# Patient Record
Sex: Female | Born: 1990 | Race: White | Hispanic: No | Marital: Single | Smoking: Never smoker
Health system: Southern US, Community
[De-identification: ages and names within clinical notes are randomized; demographics above are authoritative.]

---

## 2013-10-10 ENCOUNTER — Encounter (HOSPITAL_COMMUNITY): Payer: Self-pay | Admitting: Emergency Medicine

## 2013-10-10 ENCOUNTER — Emergency Department (HOSPITAL_COMMUNITY): Payer: BC Managed Care – PPO

## 2013-10-10 ENCOUNTER — Emergency Department (HOSPITAL_COMMUNITY)
Admission: EM | Admit: 2013-10-10 | Discharge: 2013-10-10 | Disposition: A | Discharge status: Home / Self Care | Payer: BC Managed Care – PPO | Attending: Emergency Medicine | Admitting: Emergency Medicine

## 2013-10-10 DIAGNOSIS — Z880 Allergy status to penicillin: Secondary | ICD-10-CM | POA: Insufficient documentation

## 2013-10-10 DIAGNOSIS — R0781 Pleurodynia: Secondary | ICD-10-CM

## 2013-10-10 DIAGNOSIS — R079 Chest pain, unspecified: Secondary | ICD-10-CM | POA: Insufficient documentation

## 2013-10-10 DIAGNOSIS — Z79899 Other long term (current) drug therapy: Secondary | ICD-10-CM | POA: Insufficient documentation

## 2013-10-10 MED ORDER — IBUPROFEN 400 MG PO TABS
800.0000 mg | ORAL_TABLET | Freq: Once | ORAL | Status: AC
  Administered 2013-10-10: 800 mg via ORAL
  Filled 2013-10-10: qty 2

## 2013-10-10 MED ORDER — TRAMADOL HCL 50 MG PO TABS: 50.0000 mg | ORAL_TABLET | Freq: Four times a day (QID) | ORAL | Status: AC | PRN

## 2013-10-10 NOTE — ED Notes (Signed)
PT ambulated with baseline gait; VSS; A&Ox3; no signs of distress; respirations even and unlabored; skin warm and dry; no questions upon discharge.  

## 2013-10-10 NOTE — ED Notes (Signed)
No obvious deformity noted to rib area.

## 2013-10-10 NOTE — Discharge Instructions (Signed)

## 2013-10-10 NOTE — ED Notes (Addendum)
Pt reports that she was brushing her teeth and gagged this morning. States she heard a pop in her ribs on the left side. States she went to [facility] and told she could have a hairline fx but they were unsure. Reports she had an x-ray done last Sunday

## 2013-10-10 NOTE — ED Notes (Signed)
Pt is third in line for xray.

## 2013-10-10 NOTE — ED Notes (Signed)
Pt transported to xray 

## 2013-10-10 NOTE — ED Provider Notes (Signed)
CSN: [id number]     Arrival date & time 10/10/13  1720 History   This chart was scribed for non-physician practitioner Junius FinnerErin O'Malley, PA-C working with Laray AngerKathleen M McManus, DO by Donne Anonayla Curran, ED Scribe. This patient was seen in room TR07C/TR07C and the patient's care was started at 1739.   First MD Initiated Contact with Patient 10/10/13 1739     Chief Complaint  Patient presents with  . rib pain     The history is provided by the patient. No language interpreter was used.   HPI Comments: Gail Smith is a 23 y.o. female who presents to the Emergency Department complaining of 1 week of gradual onset, gradually worsening left rib pain that began when she had a severe cough, which has since resolved. She states the rib pain acutely worsened this morning while she was brushing her teeth and heard a "crack." She has tried 800 mg ibuprofen with mild relief. She states she went to her campus health clinic 1 week ago and was told there was possibly a hairline fracture present. She denies recent trauma or falls. She denies bruising to the area, redness to the area or any other symptoms.   History reviewed. No pertinent past medical history. History reviewed. No pertinent past surgical history. History reviewed. No pertinent family history. History  Substance Use Topics  . Smoking status: Never Smoker   . Smokeless tobacco: Not on file  . Alcohol Use: No   OB History   Grav Para Term Preterm Abortions TAB SAB Ect Mult Living                 Review of Systems  Musculoskeletal: Positive for arthralgias.    Allergies  Penicillins  Home Medications   Current Outpatient Rx  Name  Route  Sig  Dispense  Refill  . albuterol (PROVENTIL HFA;VENTOLIN HFA) 108 (90 BASE) MCG/ACT inhaler   Inhalation   Inhale 1 puff into the lungs every 6 (six) hours as needed for wheezing or shortness of breath.         . desloratadine-pseudoephedrine (CLARINEX-D 12-HOUR) 2.5-120 MG per tablet    Oral   Take 1 tablet by mouth 2 (two) times daily.         Marland Kitchen. FLUoxetine (PROZAC) 20 MG tablet   Oral   Take 20 mg by mouth daily.         Marland Kitchen. guaiFENesin-codeine 100-10 MG/5ML syrup   Oral   Take 5 mLs by mouth 3 (three) times daily as needed for cough.         Marland Kitchen. ibuprofen (ADVIL,MOTRIN) 800 MG tablet   Oral   Take 800 mg by mouth every 8 (eight) hours as needed.         Marland Kitchen. lisdexamfetamine (VYVANSE) 40 MG capsule   Oral   Take 40 mg by mouth every morning.         . sulfamethoxazole-trimethoprim (BACTRIM DS) 800-160 MG per tablet   Oral   Take 1 tablet by mouth 2 (two) times daily.         . traMADol (ULTRAM) 50 MG tablet   Oral   Take 1 tablet (50 mg total) by mouth every 6 (six) hours as needed.   15 tablet   0    BP 100/65  Pulse 85  Temp(Src) 97 F (36.1 C) (Oral)  Resp 18  Ht 5\' 2"  (1.575 m)  Wt 110 lb (49.896 kg)  BMI 20.11 kg/m2  SpO2 100%  Physical  Exam  Nursing note and vitals reviewed. Constitutional: She appears well-developed and well-nourished. No distress.  HENT:  Head: Normocephalic and atraumatic.  Eyes: Conjunctivae are normal. No scleral icterus.  Neck: Normal range of motion.  Cardiovascular: Normal rate, regular rhythm and normal heart sounds.   Pulmonary/Chest: Effort normal and breath sounds normal. No respiratory distress. She has no wheezes. She has no rales. She exhibits no tenderness.  Tenderness between rib 5 and 6 left anterior mid axillary. No crepitance, deformity, erythema or ecchymosis.  Abdominal: Soft. Bowel sounds are normal. She exhibits no distension and no mass. There is no tenderness. There is no rebound and no guarding.  Musculoskeletal: Normal range of motion.  Neurological: She is alert.  Skin: Skin is warm and dry. She is not diaphoretic.    ED Course  Procedures (including critical care time) DIAGNOSTIC STUDIES: Oxygen Saturation is 99% on RA, normal by my interpretation.    COORDINATION OF CARE: 6:43  PM Discussed treatment plan which includes CXR with pt at bedside and pt agreed to plan. Advised pt to ice and rest the area.   Labs Review Labs Reviewed - No data to display Imaging Review Dg Ribs Unilateral W/chest Left  10/10/2013   CLINICAL DATA:  left pain, "pop"  .  Coughing with chest pain  EXAM: LEFT RIBS AND CHEST - 3+ VIEW  COMPARISON:  None.  FINDINGS: Normal cardiac silhouette. No pleural fluid, pulmonary edema, or pneumothorax. No evidence of infiltrate.  Dedicated views of the left ribs demonstrate no fracture. Skin marker marks the area of pain.  IMPRESSION: 1. No evidence of thoracic trauma. 2. No evidence of rib fracture or pneumothorax. 3. No evidence infiltrate.   Electronically Signed   By: Genevive Bi M.D.   On: 10/10/2013 19:17    EKG Interpretation   None       MDM   1. Rib pain on left side    CXR: unremarkable. Tx: pain control, reassurance, f/u with PCP. Return precautions provided. Pt verbalized understanding and agreement with tx plan.   I personally performed the services described in this documentation, which was scribed in my presence. The recorded information has been reviewed and is accurate.    Junius Finner, PA-C 10/10/13 2305

## 2013-10-11 NOTE — ED Provider Notes (Signed)
Medical screening examination/treatment/procedure(s) were performed by non-physician practitioner and as supervising physician I was immediately available for consultation/collaboration.  EKG Interpretation   None         Laray AngerKathleen M Baylee Mccorkel, DO 10/11/13 1145

## 2015-11-27 ENCOUNTER — Other Ambulatory Visit: Payer: Self-pay | Admitting: Orthopedic Surgery

## 2015-11-27 DIAGNOSIS — S2232XA Fracture of one rib, left side, initial encounter for closed fracture: Secondary | ICD-10-CM

## 2015-11-29 ENCOUNTER — Ambulatory Visit
Admission: RE | Admit: 2015-11-29 | Discharge: 2015-11-29 | Disposition: A | Discharge status: Home / Self Care | Payer: BLUE CROSS/BLUE SHIELD | Source: Ambulatory Visit | Attending: Orthopedic Surgery | Admitting: Orthopedic Surgery

## 2015-11-29 DIAGNOSIS — S2232XA Fracture of one rib, left side, initial encounter for closed fracture: Secondary | ICD-10-CM

## 2015-11-29 MED ORDER — IOPAMIDOL (ISOVUE-300) INJECTION 61%
75.0000 mL | Freq: Once | INTRAVENOUS | Status: AC | PRN
  Administered 2015-11-29: 75 mL via INTRAVENOUS

## 2015-12-20 ENCOUNTER — Institutional Professional Consult (permissible substitution) (INDEPENDENT_AMBULATORY_CARE_PROVIDER_SITE_OTHER): Payer: BLUE CROSS/BLUE SHIELD | Admitting: Cardiothoracic Surgery

## 2015-12-20 ENCOUNTER — Encounter: Payer: Self-pay | Admitting: Cardiothoracic Surgery

## 2015-12-20 VITALS — BP 129/87 | HR 102 | Resp 16 | Ht 62.0 in | Wt 130.0 lb

## 2015-12-20 DIAGNOSIS — R0789 Other chest pain: Secondary | ICD-10-CM | POA: Diagnosis not present

## 2015-12-20 DIAGNOSIS — G8929 Other chronic pain: Secondary | ICD-10-CM | POA: Diagnosis not present

## 2015-12-20 DIAGNOSIS — S2232XA Fracture of one rib, left side, initial encounter for closed fracture: Secondary | ICD-10-CM | POA: Diagnosis not present
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
## 2016-02-27 ENCOUNTER — Ambulatory Visit: Payer: BLUE CROSS/BLUE SHIELD | Admitting: Cardiothoracic Surgery

## 2016-02-29 ENCOUNTER — Ambulatory Visit: Payer: BLUE CROSS/BLUE SHIELD | Admitting: Cardiothoracic Surgery

## 2016-10-16 IMAGING — CT CT CHEST W/ CM
2 of 4 series · 15 of 36 positions shown, 18 images · IV contrast (APPLIED)
Comparison: No priors.

CLINICAL DATA: 24-year-old female with history of right-sided rib
fracture complaining of 3 months of chest pain.

EXAM:
CT CHEST WITH CONTRAST
TECHNIQUE: Multidetector CT imaging of the chest was performed during
intravenous contrast administration.
CONTRAST:  75mL 4T6GQ3-8LL IOPAMIDOL (4T6GQ3-8LL) INJECTION 61%

[Series 2: chest w/cm · axial · 0.57mm/px · z∈[-41,+214]mm · 12 of 57 slices shown, 15 images]
[im 3/57  mediastinal]
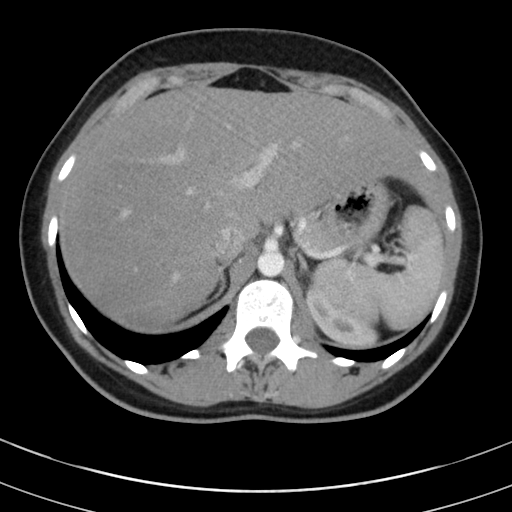
[im 3/57  lung]
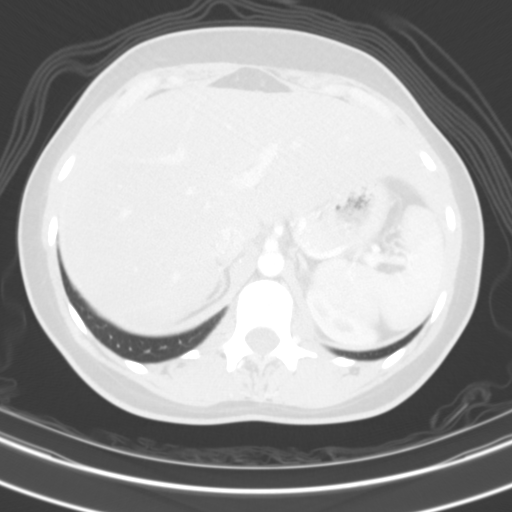
[im 8/57  lung]
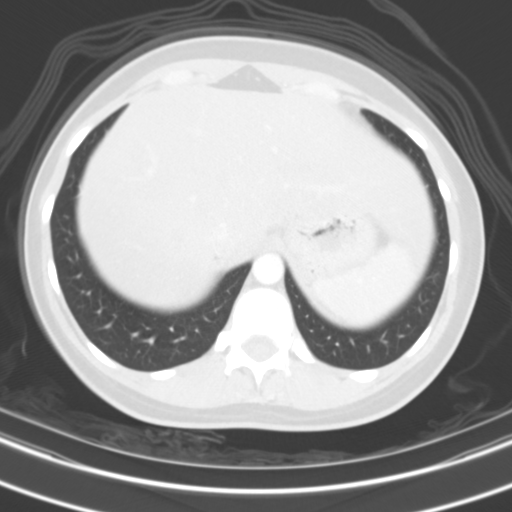
[im 12/57  lung]
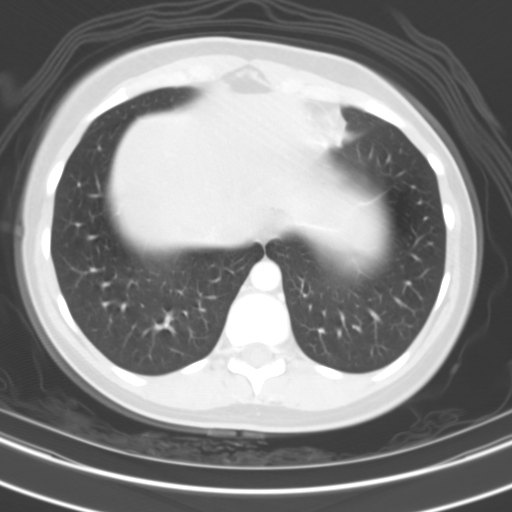
[im 17/57  lung]
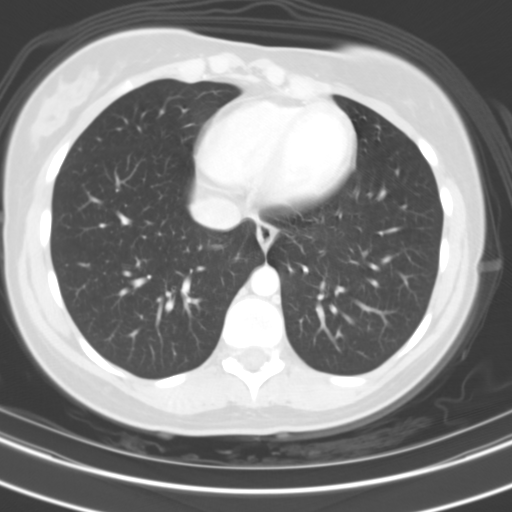
[im 22/57  mediastinal]
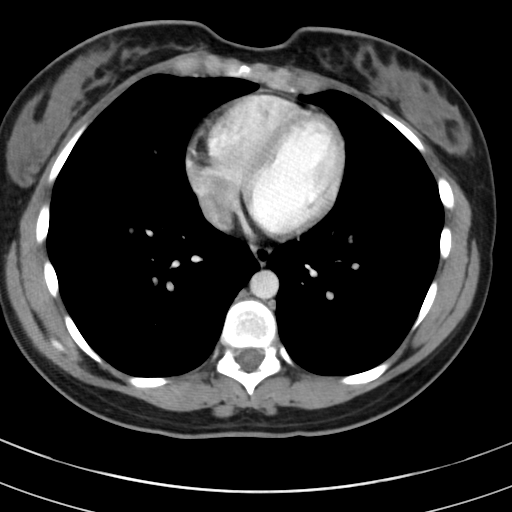
[im 22/57  lung]
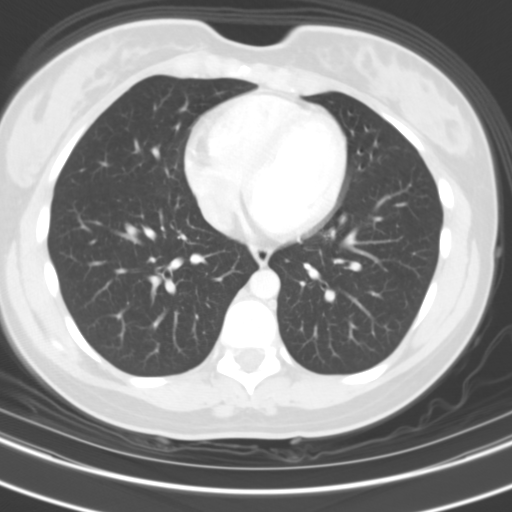
[im 26/57  lung]
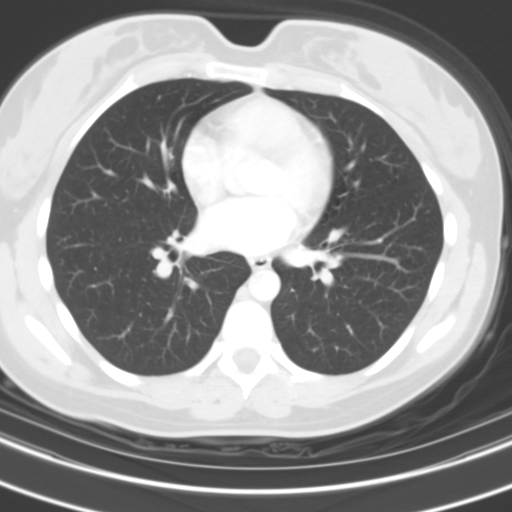
[im 31/57  lung]
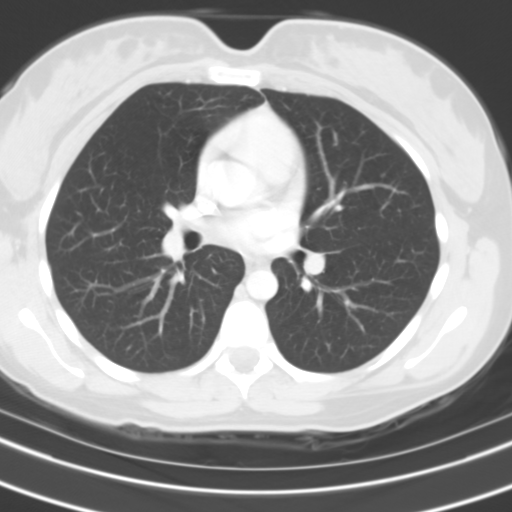
[im 36/57  lung]
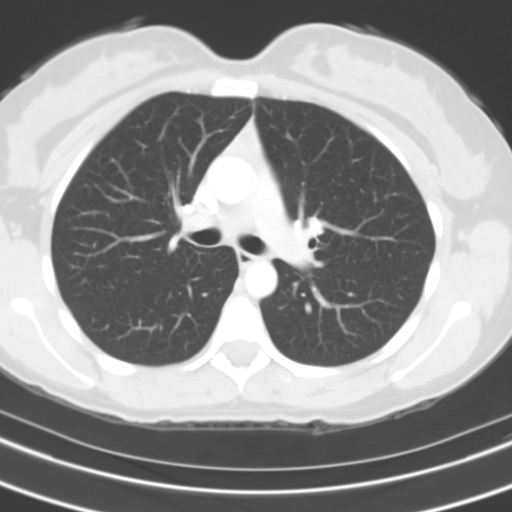
[im 40/57  mediastinal]
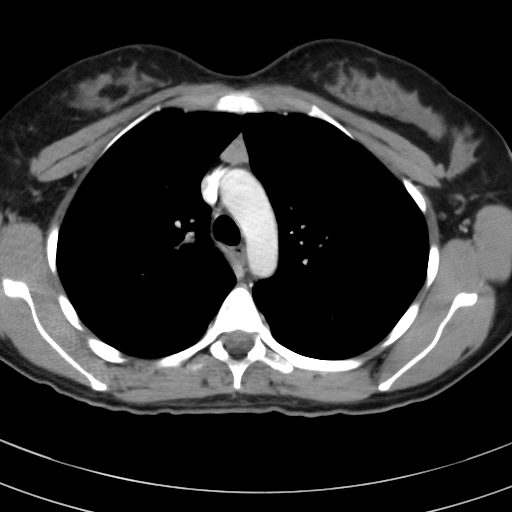
[im 40/57  lung]
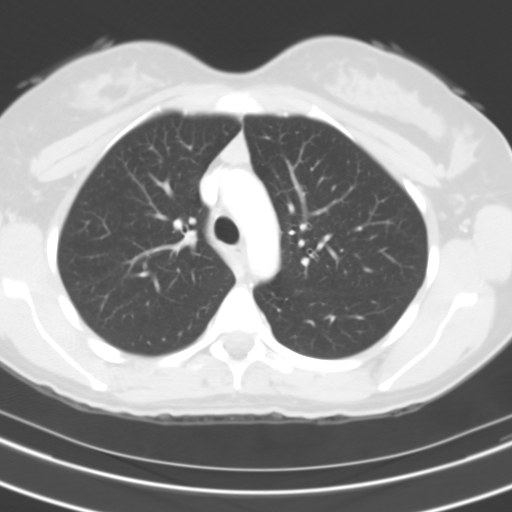
[im 45/57  lung]
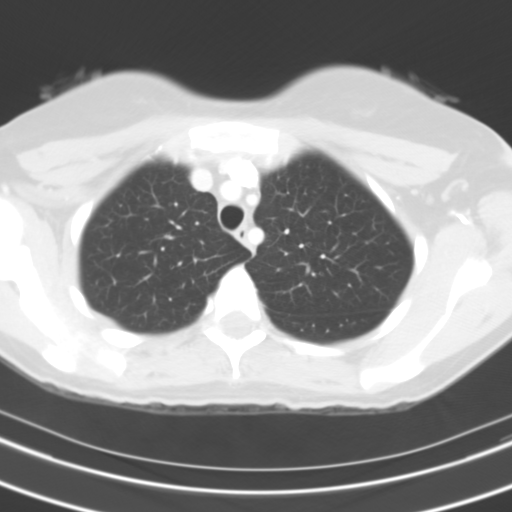
[im 50/57  lung]
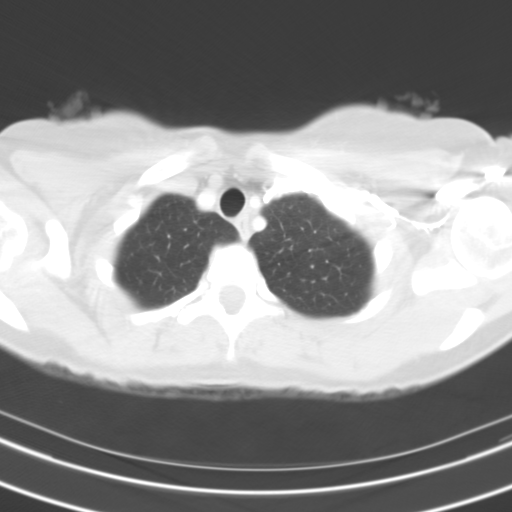
[im 54/57  lung]
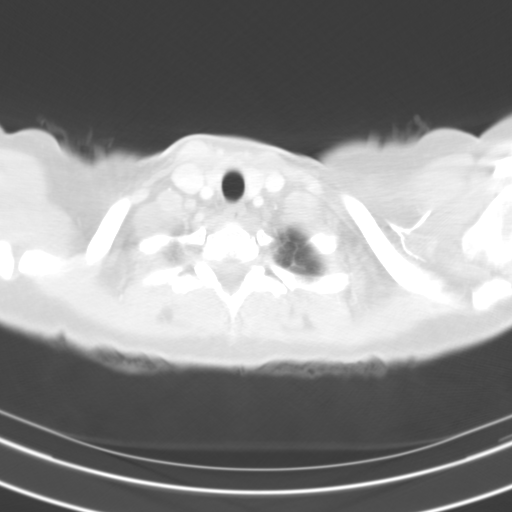

[Series 4: cor · coronal · 0.56mm/px · 3 of 78 slices shown]
[im 16/78  lung]
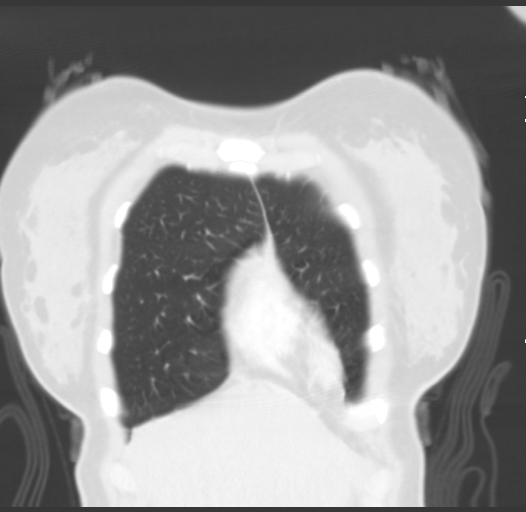
[im 31/78  lung]
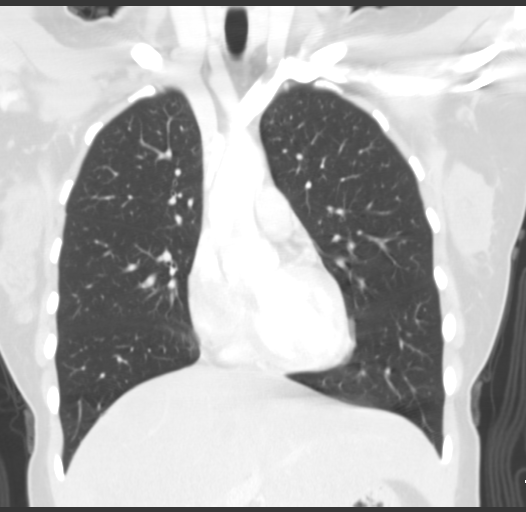
[im 47/78  lung]
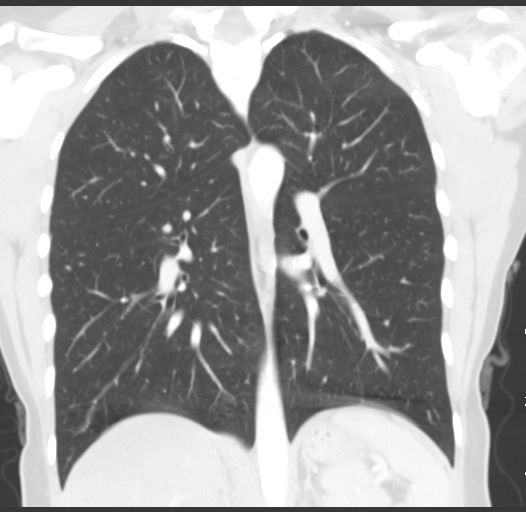

[15 of 36 positions shown; findings below may reference images not displayed]

FINDINGS: Mediastinum/Lymph Nodes: Heart size is normal. There is no
significant pericardial fluid, thickening or pericardial
calcification. No pathologically enlarged mediastinal or hilar lymph
nodes. Small amount of soft tissue in the anterior mediastinum has
morphologic characteristics compatible with residual thymic tissue.
Esophagus is normal in appearance. No axillary lymphadenopathy.

Lungs/Pleura: No pneumothorax. No acute consolidative airspace
disease. No suspicious appearing pulmonary nodules or masses. No
pleural effusions.

Upper Abdomen: Diffuse low attenuation throughout the hepatic
parenchyma, compatible with hepatic steatosis.

Musculoskeletal/Soft Tissues: No acute displaced fractures or
aggressive appearing lytic or blastic lesions are noted in the
visualized portions of the skeleton. Healing nondisplaced fracture
of the lateral aspect of the left sixth rib.
IMPRESSION: 1. Healing nondisplaced fracture of the lateral aspect of the left
sixth rib.
2. No acute or healing right-sided rib fractures.
3. No pneumothorax or other acute findings.
4. Hepatic steatosis.
